# Patient Record
Sex: Male | Born: 1940 | Race: White | Hispanic: No
Health system: Southern US, Community
[De-identification: ages and names within clinical notes are randomized; demographics above are authoritative.]

---

## 2000-03-17 ENCOUNTER — Encounter: Payer: Self-pay | Admitting: Emergency Medicine

## 2000-03-17 ENCOUNTER — Observation Stay (HOSPITAL_COMMUNITY): Admission: EM | Admit: 2000-03-17 | Discharge: 2000-03-17 | Payer: Self-pay | Admitting: Emergency Medicine

## 2000-03-26 ENCOUNTER — Ambulatory Visit (HOSPITAL_COMMUNITY): Admission: RE | Admit: 2000-03-26 | Discharge: 2000-03-26 | Payer: Self-pay | Admitting: Infectious Diseases

## 2000-04-15 ENCOUNTER — Encounter: Admission: RE | Admit: 2000-04-15 | Discharge: 2000-04-15 | Payer: Self-pay | Admitting: Internal Medicine

## 2007-07-24 ENCOUNTER — Other Ambulatory Visit: Payer: Self-pay

## 2007-07-24 ENCOUNTER — Emergency Department: Payer: Self-pay | Admitting: Emergency Medicine

## 2009-01-09 DEATH — deceased

## 2009-04-23 ENCOUNTER — Emergency Department: Payer: Self-pay | Admitting: Emergency Medicine

## 2009-06-30 ENCOUNTER — Inpatient Hospital Stay (HOSPITAL_COMMUNITY): Admission: RE | Admit: 2009-06-30 | Discharge: 2009-07-04 | Payer: Self-pay | Admitting: Neurosurgery

## 2009-10-13 ENCOUNTER — Encounter: Admission: RE | Admit: 2009-10-13 | Discharge: 2009-10-13 | Payer: Self-pay | Admitting: Neurosurgery

## 2009-11-13 ENCOUNTER — Encounter: Admission: RE | Admit: 2009-11-13 | Discharge: 2009-11-13 | Payer: Self-pay | Admitting: Neurosurgery

## 2010-02-07 ENCOUNTER — Inpatient Hospital Stay (HOSPITAL_COMMUNITY): Admission: RE | Admit: 2010-02-07 | Discharge: 2010-02-12 | Payer: Self-pay | Admitting: Neurosurgery

## 2010-08-02 ENCOUNTER — Encounter: Admission: RE | Admit: 2010-08-02 | Discharge: 2010-08-02 | Payer: Self-pay | Admitting: Neurosurgery

## 2010-12-02 ENCOUNTER — Encounter: Payer: Self-pay | Admitting: Neurosurgery

## 2010-12-28 ENCOUNTER — Other Ambulatory Visit: Payer: Self-pay | Admitting: Neurosurgery

## 2010-12-28 DIAGNOSIS — M549 Dorsalgia, unspecified: Secondary | ICD-10-CM

## 2011-01-07 ENCOUNTER — Ambulatory Visit
Admission: RE | Admit: 2011-01-07 | Discharge: 2011-01-07 | Disposition: A | Discharge status: Home / Self Care | Payer: Medicare Other | Source: Ambulatory Visit | Attending: Neurosurgery | Admitting: Neurosurgery

## 2011-01-07 ENCOUNTER — Other Ambulatory Visit: Payer: Self-pay | Admitting: Neurosurgery

## 2011-01-07 DIAGNOSIS — M549 Dorsalgia, unspecified: Secondary | ICD-10-CM

## 2011-02-01 LAB — CBC
HCT: 42.6 % (ref 39.0–52.0)
Hemoglobin: 14.4 g/dL (ref 13.0–17.0)
MCV: 105.4 fL — ABNORMAL HIGH (ref 78.0–100.0)
Platelets: 229 10*3/uL (ref 150–400)
WBC: 9.1 10*3/uL (ref 4.0–10.5)

## 2011-02-01 LAB — COMPREHENSIVE METABOLIC PANEL
AST: 19 U/L (ref 0–37)
BUN: 8 mg/dL (ref 6–23)
CO2: 26 mEq/L (ref 19–32)
Calcium: 9.8 mg/dL (ref 8.4–10.5)
Creatinine, Ser: 0.77 mg/dL (ref 0.4–1.5)
GFR calc non Af Amer: >60 mL/min (ref >60)
Glucose, Bld: 95 mg/dL (ref 70–99)

## 2011-02-01 LAB — URINALYSIS, ROUTINE W REFLEX MICROSCOPIC
Bilirubin Urine: NEGATIVE
Ketones, ur: NEGATIVE mg/dL
Nitrite: NEGATIVE
Protein, ur: NEGATIVE mg/dL

## 2011-02-01 LAB — DIFFERENTIAL
Basophils Absolute: 0.1 10*3/uL (ref 0.0–0.1)
Lymphocytes Relative: 41 % (ref 12–46)
Neutro Abs: 4.1 10*3/uL (ref 1.7–7.7)
Neutrophils Relative %: 45 % (ref 43–77)

## 2011-02-01 LAB — APTT: aPTT: 30 seconds (ref 24–37)

## 2011-02-01 LAB — PROTIME-INR
INR: 0.9 (ref 0.00–1.49)
Prothrombin Time: 12.1 seconds (ref 11.6–15.2)

## 2011-02-16 LAB — DIFFERENTIAL
Basophils Absolute: 0.1 10*3/uL (ref 0.0–0.1)
Basophils Relative: 1 % (ref 0–1)
Lymphocytes Relative: 43 % (ref 12–46)
Monocytes Absolute: 0.9 10*3/uL (ref 0.1–1.0)
Neutro Abs: 3.7 10*3/uL (ref 1.7–7.7)
Neutrophils Relative %: 43 % (ref 43–77)

## 2011-02-16 LAB — CBC
HCT: 43.5 % (ref 39.0–52.0)
Hemoglobin: 15 g/dL (ref 13.0–17.0)
MCV: 102.1 fL — ABNORMAL HIGH (ref 78.0–100.0)
Platelets: 210 10*3/uL (ref 150–400)
RDW: 14.1 % (ref 11.5–15.5)
WBC: 8.7 10*3/uL (ref 4.0–10.5)

## 2011-02-16 LAB — URINALYSIS, ROUTINE W REFLEX MICROSCOPIC
Glucose, UA: NEGATIVE mg/dL
Hgb urine dipstick: NEGATIVE
Ketones, ur: NEGATIVE mg/dL
Protein, ur: NEGATIVE mg/dL
pH: 6.5 (ref 5.0–8.0)

## 2011-02-16 LAB — COMPREHENSIVE METABOLIC PANEL
AST: 19 U/L (ref 0–37)
BUN: 9 mg/dL (ref 6–23)
CO2: 28 mEq/L (ref 19–32)
Calcium: 9.8 mg/dL (ref 8.4–10.5)
Chloride: 104 mEq/L (ref 96–112)
Creatinine, Ser: 0.83 mg/dL (ref 0.4–1.5)
GFR calc non Af Amer: >60 mL/min (ref >60)
Total Bilirubin: 0.8 mg/dL (ref 0.3–1.2)

## 2011-02-16 LAB — PROTIME-INR
INR: 1 (ref 0.00–1.49)
Prothrombin Time: 12.6 seconds (ref 11.6–15.2)

## 2011-02-16 LAB — APTT: aPTT: 30 seconds (ref 24–37)

## 2011-03-26 NOTE — Discharge Summary (Signed)
[name], [name] NO.:  [id number]   MEDICAL RECORD NO.:  [id number]          PATIENT TYPE:  INP   LOCATION:  3015                         FACILITY:  [facility]   PHYSICIAN:  Payton Doughty, M.D.      DATE OF BIRTH:  1941/06/13   DATE OF ADMISSION:  06/30/2009  DATE OF DISCHARGE:  07/04/2009                               DISCHARGE SUMMARY   ADMITTING DIAGNOSIS:  Spondylosis at L2-3, L3-4, and L4-5.   DISCHARGE DIAGNOSIS:  Spondylosis at L2-3, L3-4, and L4-5.   OPERATIVE PROCEDURE:  L2-3, L3-4, and L4-5 decompressive lumbar  laminectomy.   COMPLICATION:  None.   DISCHARGE STATUS:  Alive and well.   BODY OF TEXT:  This is a 70 year old gentleman whose history and  physical is recounted in the chart.  He basically developed neurogenic  claudication and spinal stenosis.  General exam is intact.  Neurologic  exam was intact and he was admitted after ascertaining normal laboratory  values and underwent decompressive laminectomy at L2-3, L3-4, and L4-5.  Postoperatively, he has done well.  He has been hypertensive until his  pain was under control.  He was on the PCA for a couple of days and the  PCA was stopped.  He is now on Percocet.  Blood pressure is under good  control.  His strength is full.  His incision is dry and well healing.  He is being discharged home with his family.  His daughter is going to  stay with him for about a week.  He is going to call the office and come  in for suture removal next week.           ______________________________  Payton Doughty, M.D.     MWR/MEDQ  D:  07/04/2009  T:  07/05/2009  Job:  [id number] [id number]

## 2011-03-26 NOTE — Op Note (Signed)
NAMEDAREION, KNEECE NO.:  [id number]   MEDICAL RECORD NO.:  [id number]          PATIENT TYPE:  INP   LOCATION:  3015                         FACILITY:  [facility]   PHYSICIAN:  Payton Doughty, M.D.      DATE OF BIRTH:  February 19, 1941   DATE OF PROCEDURE:  06/30/2009  DATE OF DISCHARGE:                               OPERATIVE REPORT   PREOPERATIVE DIAGNOSIS:  Spondylosis, spinal stenosis at L2-3, L3-4, and  L4-5.   POSTOPERATIVE DIAGNOSIS:  Spondylosis, spinal stenosis at L2-3, L3-4,  and L4-5.   PROCEDURE:  L2-3, L3-4, and L4-5, decompressive lumbar laminectomy.   SURGEON:  Payton Doughty, MD   ANESTHESIA:  General endotracheal.   PREPARATION:  Prepped and draped with alcohol wipe.   COMPLICATIONS:  None.   NURSE ASSISTANT:  Kathleene Hazel, RN   DOCTOR ASSISTANT:  Danae Orleans. Venetia Maxon, MD   BODY OF TEXT:  A 70 year old with neurogenic claudication, secondary to  spinal stenosis, taken to operative room, smoothly anesthetized and  intubated, placed prone on the operating table.  Following shave, prep  and drape in usual sterile fashion, skin was infiltrated with  1%  lidocaine with 1:400,000 epinephrine.  Skin was incised from the top of  L2 to mid L5.  The lamina of L2, L3, L4 and the top L5 were exposed  bilaterally in subperiosteal plane.  Intraoperative x-ray confirmed the  correctness of the level.  Total laminectomy of L3-L4 as well as the  inferior half of L2 and superior half of L5 was carried out using the  Kerrison, Leksell, and drill.  Ligamentum flavum was removed at all  these levels resulting in decompression of spinal canal.  No CSF leak  was encountered.  His highest level was at L3-4.  Following complete  decompression of these levels, wound was irrigated.  Hemostasis assured  and Depo-Medrol soaked, fat placed in laminectomy defect.  Successive  layers of 0 Vicryl, 2-0 Vicryl, and 3-0 nylon were used to close.  Betadine Telfa dressing was applied, made  occlusive with OpSite and the  patient returned to recovery room in good condition.           ______________________________  Payton Doughty, M.D.     MWR/MEDQ  D:  06/30/2009  T:  07/01/2009  Job:  [id number]

## 2011-03-26 NOTE — H&P (Signed)
NAME:  [name] [name], [name] [name] NO.:  [id number]   MEDICAL RECORD NO.:  [id number]           PATIENT TYPE:   LOCATION:                                 FACILITY:   PHYSICIAN:  Payton Doughty, M.D.           DATE OF BIRTH:   DATE OF ADMISSION:  06/30/2009  DATE OF DISCHARGE:                              HISTORY & PHYSICAL   ADMISSION DIAGNOSIS:  Spondylosis L2-3, L3-4, L4-5.   BODY OF TEXT:  A very nice, now 70 year old right-handed white gentleman  operated in 1994 at 4-5, 5-6, and 6-7.  He came to my office last  November with pain in his shoulder.  I felt it was related to his  shoulder and send back to Dr. Cleophas Dunker to get his shoulder fixed and  comes back now with pain in his back and getting down his legs.  MR was  obtained that shows severe spinal stenosis at L2-3, L3-4, and L4-5.  He  is admitted now for decompressive laminectomy.   Medical history is, otherwise, benign.   He is on oxycodone, lisinopril.   He has no allergies.   SURGICAL HISTORY:  Operation of neck and shoulder.   SOCIAL HISTORY:  Smokes a pack cigarettes a day.  Does not drink  alcohol.  He is retired.   FAMILY HISTORY:  Both parents are deceased.  History is not given.   REVIEW OF SYSTEMS:  Marked for arm weakness, back pain, arm pain, leg  pain, joint pain, and neck pain.   PHYSICAL EXAMINATION:  HEENT:  Within normal limits.  NECK:  He has good range of motion of his neck with no Lhermitte's.  CHEST:  Clear.  CARDIAC:  Regular rate and rhythm.  ABDOMEN:  Nontender with no hepatosplenomegaly.  EXTREMITIES:  Without clubbing or cyanosis.  Peripheral pulses are good.  GU:  Deferred.  NEUROLOGICAL:  He is awake, alert and oriented.  Cranial nerves are  intact.  Motor exam shows 5/5 strength throughout the upper and lower  extremities when seated.  When he gets up and walks about, he has  subjective weakness and difficulty getting up and down steps.  Reflexes  are absent on the lower  extremities.  Sensory dysesthesias are  intermittent, worse when he is up.   MR shows a significant stenosis at L2-3, 3-4, and 4-5.   CLINICAL IMPRESSION:  Neurogenic claudication secondary to spinal  spondylosis.   PLAN:  Decompressive laminectomy L2, 3, 4, on top of 5.  The risks and  benefits have been discussed with him and he wishes to proceed.           ______________________________  Payton Doughty, M.D.     MWR/MEDQ  D:  06/30/2009  T:  06/30/2009  Job:  [id number]

## 2011-03-29 NOTE — Discharge Summary (Signed)
[place]  Patient:    [name], [name]                        MRN: [id number] Adm. Date:  03/17/00 Disc. Date: 03/17/00 Dictator:   Tawni Millers                           Discharge Summary  DATE OF BIRTH:  11-19-2040  DISCHARGE MEDICATIONS: 1. Protonix 40 mg p.o. q.d. 2. Aspirin one p.o. q.d. 3. Albuterol inhaler two puffs q.4h. p.r.n. shortness of breath or cough.  DISCHARGE DIAGNOSES: 1. Atypical chest pain of unknown etiology. 2. Alcohol intoxication. 3. Tobacco abuse. 4. History of hypertension. 5. Chronic bronchitis secondary to #3.  HISTORY OF PRESENT ILLNESS:  This is a 70 year old white male with a history of very heavy drinking, who was drinking with friends when he had onset of chest pain.  EMS was called and patient was brought to the ED.  The only history we have is that the patient has had fleeting episodes of chest pain the past two weeks, unassociated with anything in particular.  This has happened when he has been intoxicated and nonintoxicated.  The family states that the patient has had increased ankle swelling over the last few weeks, and that he has been in a cycle of working from 6 a.m. to 3 p.m., then going to drink with friends until the middle of the night, sleeping three hours, then returning to work.  PAST MEDICAL HISTORY:  Hypertension.  SOCIAL HISTORY:  He smokes two packs a day for many years.  He drinks heavily especially during the past three months; he has been drinking heavily since he was a teenager in spells on and off, mostly on.  He has tried rehab in the past without success.  He works at a [employer].  Lives with his wife, but spends most of his time at his drinking buddys house.  MEDICINES:  None.  ALLERGIES:  No known drug allergies.  PHYSICAL EXAMINATION:  VITALS:  Blood pressure 133/83, pulse 98, temperature 97.0, respirations 21.  GENERAL:  He is lying in bed sleeping arousable but  somnolent and then falls back to sleep immediately.  EYES:  Pupils are slightly dilated at 5 mm but reactive slowly to light and he is anicteric.  LUNGS:  Moderate expiratory wheezes throughout with good air movement.  CARDIOVASCULAR:  Regular rate and rhythm with no murmurs heard.  ABDOMEN:  Distended slightly with positive bowel sounds, mild tenderness in the right upper quadrant, no rebound or guarding.  EXTREMITIES:  Edema 2+ at the ankles.  NEUROLOGIC:  He is somnolent secondary to alcohol.  MUSCULOSKELETAL:  Not assessed.  LABORATORY WORK:  Alcohol level of 297 with a white cell count of 8.1, hemoglobin 13.8, hematocrit 14.5, platelets 246.  Neutrophil count 2.5 and lymphocyte count 4.1.  Sodium 144, potassium 3.5, chloride 109, bicarb 27, BUN 12, creatinine 0.9, sugar 110, and calcium 8.6.  Total protein 7.0, albumin 3.9, AST of 32, ALT of 25, and total bili 0.2, alk phos of 89.  EKG:  Normal sinus rhythm with no acute changes.  CHEST X-RAY:  Mild COPD with hyperinflation.  HOSPITAL COURSE:  #1 - CHEST PAIN:  Given the patients risk factors of hypertension and smoking and his age, it was prudent to bring him to rule him out for MI.  He still has 38-40% chance of coronary  artery disease, and though he ruled out this admission, we will set him up for an outpatient stress test.  His lipids checked this admission showed a total cholesterol of 222, triglycerides 389, HDL 58, and LDL of 86.  Upon discharge, the patient had been pain-free for many hours.  #2 - ALCOHOL INTOXICATION:  Patient showed no signs of withdrawal while he was in-house; however, I advised him to cease drinking, and provided the number to alcohol and drug services.  #3 - CHRONIC BRONCHITIS SECONDARY TO SMOKING:  Patient was wheezing significantly this admission.  A chest x-ray (PA and lateral) the day of discharge showed just some COPD but no other acute disease.  We will send him home with some  albuterol p.r.n.  #4 - HYPERTENSION:  Had considered restarting the patients outpatient treatment of hypertension, which he never was on; however, he was not hypertensive this admission.  We will assess this at his follow up and treat as indicated.  DISCHARGE PLANNING:  The patient was set up for a stress test on May 16 at 2 p.m.  OTHER STUDIES THIS ADMISSION:  Patient had a 2-D echo performed this admission which showed only a trace of mitral regurgitation otherwise normal.  CONDITION:  Patient left the floor in stable condition. DD:  03/17/00 TD:  03/18/00 Job: [id number]

## 2011-10-16 ENCOUNTER — Ambulatory Visit: Payer: Self-pay | Admitting: Neurosurgery

## 2012-04-07 ENCOUNTER — Inpatient Hospital Stay: Payer: Self-pay | Admitting: Internal Medicine

## 2012-04-07 DIAGNOSIS — R079 Chest pain, unspecified: Secondary | ICD-10-CM

## 2012-04-07 LAB — CK TOTAL AND CKMB (NOT AT ARMC)
CK, Total: 55 U/L (ref 35–232)
CK, Total: 66 U/L (ref 35–232)
CK, Total: 107 U/L (ref 35–232)
CK-MB: 0.9 ng/mL (ref 0.5–3.6)
CK-MB: 2.1 ng/mL (ref 0.5–3.6)

## 2012-04-07 LAB — BASIC METABOLIC PANEL
Anion Gap: 12 (ref 7–16)
BUN: 11 mg/dL (ref 7–18)
Calcium, Total: 9.1 mg/dL (ref 8.5–10.1)
Chloride: 99 mmol/L (ref 98–107)
Co2: 26 mmol/L (ref 21–32)
Creatinine: 0.85 mg/dL (ref 0.60–1.30)
EGFR (Non-African Amer.): >60
Osmolality: 278 (ref 275–301)
Sodium: 137 mmol/L (ref 136–145)

## 2012-04-07 LAB — TROPONIN I
Troponin-I: <0.02 ng/mL
Troponin-I: <0.02 ng/mL

## 2012-04-07 LAB — CBC
HCT: 40 % (ref 40.0–52.0)
HGB: 13.5 g/dL (ref 13.0–18.0)
MCH: 34 pg (ref 26.0–34.0)
Platelet: 266 10*3/uL (ref 150–440)
RBC: 3.98 10*6/uL — ABNORMAL LOW (ref 4.40–5.90)
RDW: 12.9 % (ref 11.5–14.5)
WBC: 16.8 10*3/uL — ABNORMAL HIGH (ref 3.8–10.6)

## 2012-04-07 LAB — HEPATIC FUNCTION PANEL A (ARMC)
Albumin: 3.1 g/dL — ABNORMAL LOW (ref 3.4–5.0)
Alkaline Phosphatase: 105 U/L (ref 50–136)
SGOT(AST): 15 U/L (ref 15–37)
SGPT (ALT): 16 U/L
Total Protein: 7.8 g/dL (ref 6.4–8.2)

## 2012-04-08 LAB — CBC WITH DIFFERENTIAL/PLATELET
Eosinophil #: 0 10*3/uL (ref 0.0–0.7)
Eosinophil %: 0 %
HGB: 13.2 g/dL (ref 13.0–18.0)
Lymphocyte #: 1.9 10*3/uL (ref 1.0–3.6)
MCH: 33.2 pg (ref 26.0–34.0)
MCHC: 32.8 g/dL (ref 32.0–36.0)
MCV: 101 fL — ABNORMAL HIGH (ref 80–100)
Monocyte #: 0.9 x10 3/mm (ref 0.2–1.0)
Monocyte %: 5.2 %
Neutrophil #: 15.1 10*3/uL — ABNORMAL HIGH (ref 1.4–6.5)
Platelet: 287 10*3/uL (ref 150–440)
RBC: 3.96 10*6/uL — ABNORMAL LOW (ref 4.40–5.90)
RDW: 13 % (ref 11.5–14.5)

## 2012-04-08 LAB — BASIC METABOLIC PANEL
Anion Gap: 11 (ref 7–16)
BUN: 12 mg/dL (ref 7–18)
Calcium, Total: 9 mg/dL (ref 8.5–10.1)
Chloride: 102 mmol/L (ref 98–107)
Co2: 25 mmol/L (ref 21–32)
EGFR (African American): >60
EGFR (Non-African Amer.): >60
Osmolality: 279 (ref 275–301)
Potassium: 3 mmol/L — ABNORMAL LOW (ref 3.5–5.1)
Sodium: 138 mmol/L (ref 136–145)

## 2012-04-10 LAB — WBC: WBC: 18.7 10*3/uL — ABNORMAL HIGH (ref 3.8–10.6)

## 2012-09-06 IMAGING — CR DG CHEST 2V
1 series · 3 of 3 positions shown · non-contrast
Comparison: none

REASON FOR EXAM: SOB
COMMENTS:

PROCEDURE:     DXR - DXR CHEST PA (OR AP) AND LATERAL  - April 07, 2012  [DATE]
RESULT:     Comparison: None.

[Series 1: x chest ap · 0.14mm/px · 3 of 3 slices shown]
[im 1/3]
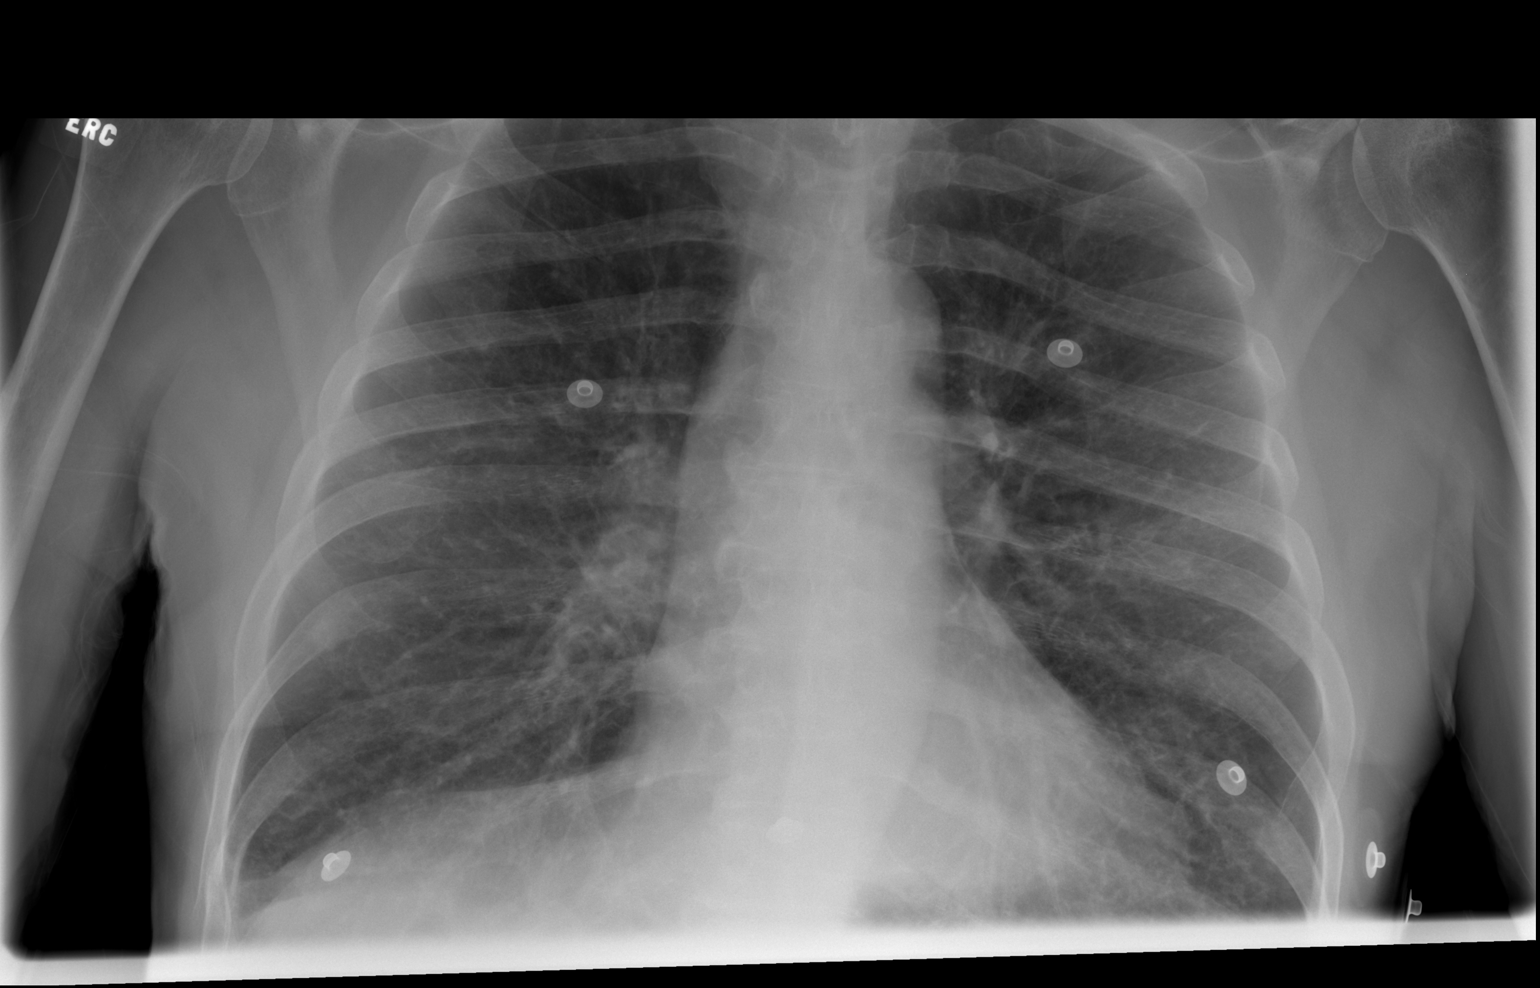
[im 2/3]
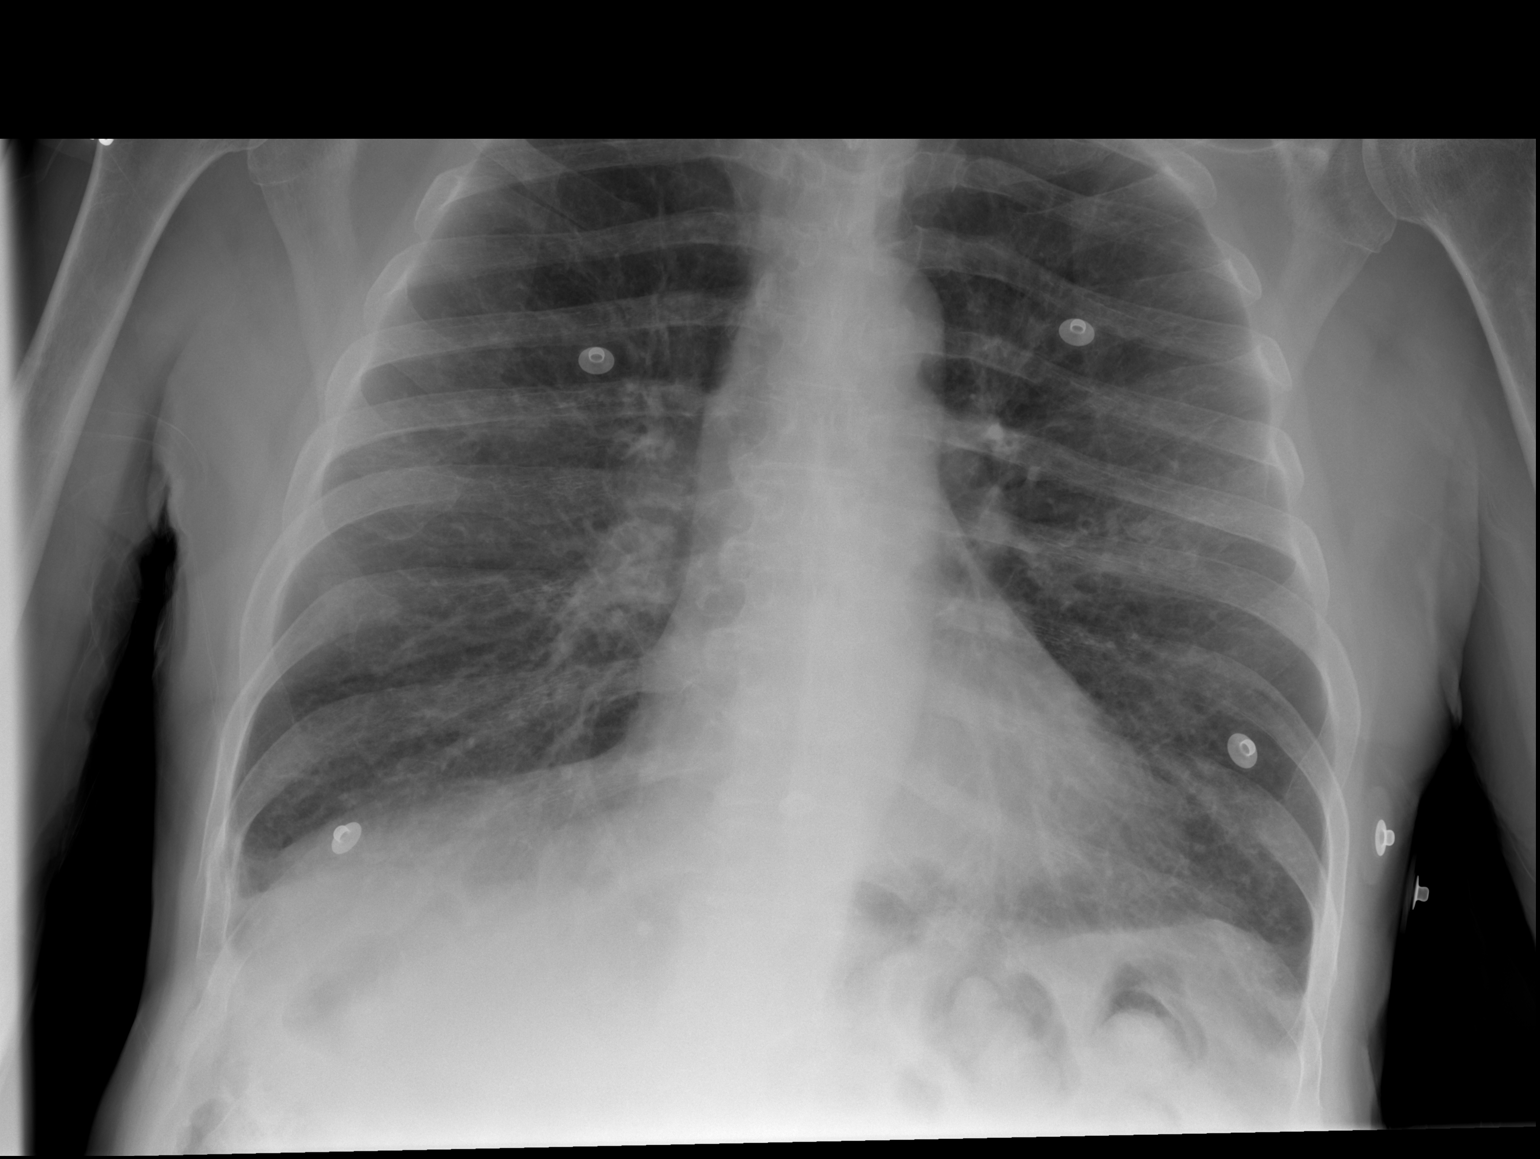
[im 3/3]
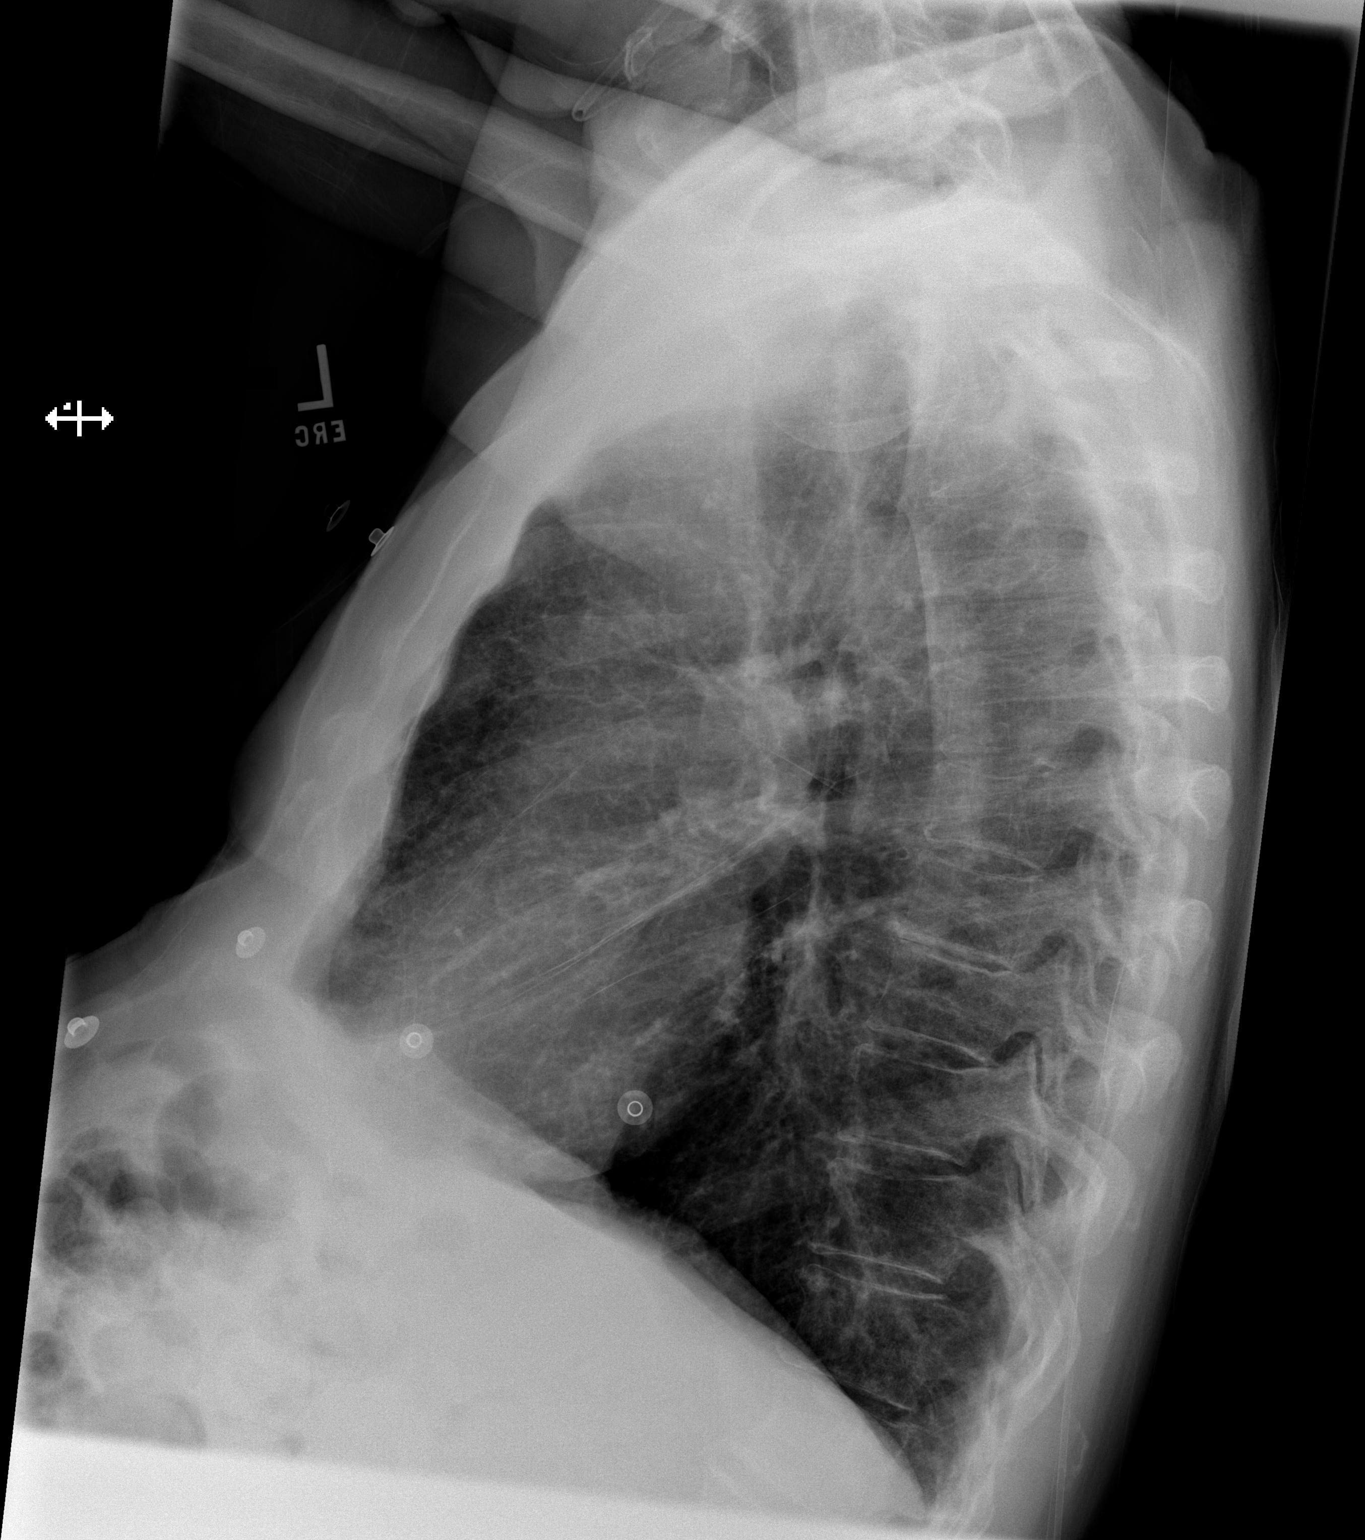

[3 of 3 positions shown; findings below may reference images not displayed]

FINDINGS: The heart and mediastinum are normal limits. There is mild prominence of the
pulmonary interstitium. Small nodular density overlying the right anterior
sixth rib may represent a nipple shadow. Otherwise, no focal pulmonary
opacities.
IMPRESSION: 1. Mild prominence of the pulmonary interstitium is nonspecific.
Differential would include mild interstitial edema and atypical infection in
the acute setting.
2. Small nodular density in the periphery of the right lower lung may
represent a nipple shadow. However, followup PA and lateral chest
radiographs with nipple markers are recommended to exclude a true pulmonary
nodule.

## 2015-03-05 NOTE — H&P (Signed)
PATIENT NAME:  [name] [name], [name] [name] MR#:  [id number] DATE OF BIRTH:  06-Apr-1941  DATE OF ADMISSION:  04/07/2012  PRIMARY CARE PHYSICIAN: Dr. Sherrie MustacheFisher  CHIEF COMPLAINT: Shortness of breath.   SUBJECTIVE: This is a 74 year old male with a history of nicotine dependence who comes to the ER with a chief complaint of shortness of breath. The patient has not been feeling well for the last three days. He complains of a nonproductive cough associated with some chest discomfort. He also feels "somewhat nauseous" but no episodes of vomiting or diarrhea. The patient was found to be febrile with a temperature 100.1 as well as tachycardia with a pulse of 129. The patient was also found to be tachypneic and was treated for chronic obstructive pulmonary disease exacerbation in the ED.   PAST MEDICAL HISTORY: 1. History of chronic pain syndrome secondary to back pain.  2. Chronic obstructive pulmonary disease.  3. Nicotine dependence.  4. Hypertension.   PAST SURGICAL HISTORY: Previous back surgery.   ALLERGIES: None known drug allergies.   SOCIAL HISTORY: The patient smokes 1 pack of cigarettes a day. He lives alone currently.   FAMILY HISTORY: Parents are deceased from natural causes. He has no siblings.   REVIEW OF SYSTEMS: CONSTITUTIONAL: Low-grade fever at home associated with fatigue and weakness. Weight loss of about 8 to 10 pounds. EYES: No blurry vision, double vision. ENT: No epistaxis, discharge, snoring, postnasal drip. RESPIRATORY: Nonproductive cough but no wheezing, no hemoptysis or dyspnea. CARDIOVASCULAR: No chest pain, orthopnea, edema, arrhythmia. GASTROINTESTINAL: No nausea, vomiting, abdominal pain, hematemesis. GENITOURINARY: No dysuria, hematuria, renal calculi, frequency. ENDOCRINE: No polyuria, nocturia, thyroid problems or increased sweating. SKIN: No acne, rash, lesion or change in mole or hair or skin. MUSCULOSKELETAL: Moving all four extremities spontaneously. NEUROLOGIC: No numbness,  weakness, dysarthria, epilepsy or tremor. PSYCHIATRIC: Patient does not appear to be anxious or have insomnia. No evidence of bipolar disorder.   PHYSICAL EXAMINATION:  VITAL SIGNS: Blood pressure 150/79, pulse 129, temperature 100.1, 96% on 2 liters.   GENERAL: Comfortable, currently no acute cardiopulmonary distress.   HEENT: Pupils equal and reactive to light. Extraocular movements intact.   NECK: Supple. No JVD.   LUNGS: Clear to auscultation bilaterally. No wheezing, no crackles, no rhonchi.   CARDIOVASCULAR: Regular rate and rhythm. No murmurs, rubs or gallops.   ABDOMEN: Soft, nontender, nondistended. Normoactive bowel sounds.   EXTREMITIES: Without cyanosis, clubbing, or edema.   NEUROLOGIC: Cranial nerves II through XII grossly intact. Deep tendon reflexes 2+ bilaterally. Babinski negative. No cerebellar deficits. Finger to nose testing is intact. Gait not tested.   PSYCH: Appropriate mood and affect without any evidence of depression or insomnia.   LABORATORY, DIAGNOSTIC AND RADIOLOGICAL DATA: WBC 16.8, hemoglobin 13.5, hematocrit 40, platelet count 266, glucose 176, BUN 11, creatinine 0.85, sodium 137, potassium 3.4, chloride 99, bicarbonate 26, anion gap 12, CK 55, total bilirubin 0.6, ALT 16, AST 15, troponin 0.02, pH  7.4, pCO2 40, pO2 67, FiO2 28.   ASSESSMENT AND PLAN:  1. Chronic obstructive pulmonary disease exacerbation. The patient's chest x-ray does not show any obvious pneumonia but given the high white count the patient will be started on antibiotics, namely Levaquin. The patient will also continue with DuoNeb treatments as needed. The patient will be admitted to telemetry floor.  2. Hypertension. The patient takes ACE inhibitor at home. This will be held in the setting of his infection. We will treat with p.r.n. hydralazine as needed if systolic blood pressure goes above  150.  3. Chronic pain syndrome. The patient takes OxyContin on a daily basis. The patient's  long-acting narcotics will be held and the patient will be provided with Percocet as needed given his hypercapnia.  4. CODE STATUS: He is a FULL CODE.   ____________________________ Richarda Overlie, MD na:cms [name]: 04/07/2012 03:41:20 ET T: 04/07/2012 06:48:25 ET JOB#: [id number]  cc: Richarda Overlie, MD, <Dictator> Demetrios Isaacs. Sherrie Mustache, MD  Richarda Overlie MD ELECTRONICALLY SIGNED 04/22/2012 0:14

## 2015-03-05 NOTE — Discharge Summary (Signed)
PATIENT NAME:  [name] [name], [name] MR#:  [id number] DATE OF BIRTH:  01/03/1941  DATE OF ADMISSION:  04/07/2012 DATE OF DISCHARGE:  04/10/2012  ADMITTING PHYSICIAN: Richarda OverlieNayana Abrol, MD DISCHARGING PHYSICIAN: Enid Baasadhika Zilla Shartzer, MD.   PRIMARY CARE PHYSICIAN: Dr. Sherrie MustacheFisher.  CONSULTATIONS IN THE HOSPITAL: None.   DISCHARGE DIAGNOSES:  1. Acute chronic obstructive pulmonary disease exacerbation with acute bronchitis.  2. Accelerated hypertension.  3. Tobacco use disorder.  4. Chronic back pain.   DISCHARGE MEDICATIONS:  1. Norco 5/325 mg 1 tablet p.o. every six hours p.r.n.  2. Lisinopril 40 mg p.o. daily.  3. Imdur 30 mg p.o. daily.  4. Clonidine 0.1 mg p.o. b.i.d.  5. Symbicort 160/4.5, two puffs b.i.d.  6. Proventil inhaler 2 puffs every six hours p.r.n.  7. Prednisone taper.  8. Levaquin 500 mg p.o. daily for three more days.   DISCHARGE HOME OXYGEN: None.   DISCHARGE DIET: Low sodium diet.   DISCHARGE ACTIVITY: As tolerated.   FOLLOWUP INSTRUCTIONS: Primary care physician follow-up in 1 to 2 weeks. Appointment scheduled with Dr. Sherrie MustacheFisher for 04/24/2012 at 3:00 p.m.   DISCHARGE INSTRUCTIONS: Smoking cessation advised.   LABS AND IMAGING STUDIES: WBC 18.0, hemoglobin 13.2, hematocrit 40.1, platelet count 287. Sodium 138, potassium 3.0, chloride 102, bicarbonate 25, BUN 12, creatinine 0.59, glucose 166. Troponins have remained negative while in the hospital though CK-MB was slightly elevated. Echo Doppler showing normal LV systolic function, ejection fraction greater than 55%, right ventricular systolic function is also normal and no pericardial effusion is present. Blood cultures have remained negative in the hospital. Chest x-ray on admission showing clear lung fields. Mild prominence of interstitium, which is nonspecific. Could be interstitial edema versus atypical infection in acute setting. Small nodular density in periphery of right lower lung, probably a nipple shadow.   BRIEF HOSPITAL  COURSE: [name] [name] is a 74 year old male with past medical history significant for chronic obstructive pulmonary disease with ongoing smoking who presented with difficulty breathing for three days and also nausea episode without vomiting or diarrhea. He was found to be slightly febrile with temperature 100.1, tachycardic and also hypoxic on admission. 1. Acute chronic obstructive pulmonary disease exacerbation with bronchitis. He did have productive cough. The chest x-ray did not show any lobar infiltrate. He was placed on IV Solu-Medrol and also Levaquin. He has not had further fevers while in the hospital and his bleeding gradually improved with nebulizer treatments and steroids and antibiotic. He was initially requiring four liters oxygen and at the time of discharge, he was saturating more than 92% on room air at rest as well as ambulating. He is being discharged on prednisone taper and also Levaquin and was strongly counseled to stop smoking.  2. Hypertension. The patient was only on lisinopril at home, but his blood pressure remained high while in the hospital so his medications were adjusted to lisinopril, Imdur and clonidine at the time of discharge and will follow up with PCP in two weeks after discharge.  3. Chronic back pain. He was on Norco while in the hospital.   CODE STATUS: FULL CODE.   DISCHARGE CONDITION: Stable.   DISCHARGE DISPOSITION: Home.        TIME SPENT ON DISCHARGE: 45 minutes.   ____________________________ Enid Baasadhika Mallorey Odonell, MD rk:ap D: 04/13/2012 10:29:16 ET T: 04/13/2012 11:26:19 ET JOB#: [id number]  cc: Enid Baasadhika Grant Henkes, MD, <Dictator> Demetrios Isaacsonald E. Sherrie MustacheFisher, MD Enid BaasADHIKA Niaja Stickley MD ELECTRONICALLY SIGNED 04/14/2012 19:29

## 2022-12-12 DEATH — deceased
